# Patient Record
Sex: Female | Born: 1965
Health system: Southern US, Community
[De-identification: ages and names within clinical notes are randomized; demographics above are authoritative.]

---

## 2005-06-19 ENCOUNTER — Ambulatory Visit: Payer: Self-pay | Admitting: Family Medicine

## 2005-09-03 ENCOUNTER — Emergency Department: Payer: Self-pay | Admitting: General Practice

## 2007-10-07 ENCOUNTER — Ambulatory Visit: Payer: Self-pay

## 2007-10-24 ENCOUNTER — Ambulatory Visit: Payer: Self-pay | Admitting: Gastroenterology

## 2008-11-05 ENCOUNTER — Ambulatory Visit: Payer: Self-pay

## 2009-11-15 ENCOUNTER — Ambulatory Visit: Payer: Self-pay

## 2010-12-21 ENCOUNTER — Ambulatory Visit: Payer: Self-pay

## 2011-01-11 ENCOUNTER — Emergency Department: Payer: Self-pay | Admitting: *Deleted

## 2011-01-22 ENCOUNTER — Ambulatory Visit: Payer: Self-pay | Admitting: Surgery

## 2011-01-23 ENCOUNTER — Ambulatory Visit: Payer: Self-pay | Admitting: Surgery

## 2012-01-09 ENCOUNTER — Ambulatory Visit: Payer: Self-pay

## 2012-02-14 ENCOUNTER — Ambulatory Visit: Payer: Self-pay | Admitting: Surgery

## 2012-02-14 LAB — COMPREHENSIVE METABOLIC PANEL
Albumin: 4 g/dL (ref 3.4–5.0)
Anion Gap: 7 (ref 7–16)
Bilirubin,Total: 0.3 mg/dL (ref 0.2–1.0)
Calcium, Total: 8.9 mg/dL (ref 8.5–10.1)
Co2: 24 mmol/L (ref 21–32)
Creatinine: 0.92 mg/dL (ref 0.60–1.30)
EGFR (African American): >60
EGFR (Non-African Amer.): >60
Osmolality: 278 (ref 275–301)
Total Protein: 7.5 g/dL (ref 6.4–8.2)

## 2012-02-14 LAB — CBC WITH DIFFERENTIAL/PLATELET
Basophil #: 0 10*3/uL (ref 0.0–0.1)
Eosinophil #: 0.1 10*3/uL (ref 0.0–0.7)
Eosinophil %: 1.7 %
HCT: 37.9 % (ref 35.0–47.0)
HGB: 12.9 g/dL (ref 12.0–16.0)
Lymphocyte %: 28.9 %
MCHC: 33.9 g/dL (ref 32.0–36.0)
Neutrophil #: 3.8 10*3/uL (ref 1.4–6.5)
Neutrophil %: 61 %
RBC: 4.15 10*6/uL (ref 3.80–5.20)
RDW: 12.4 % (ref 11.5–14.5)
WBC: 6.2 10*3/uL (ref 3.6–11.0)

## 2012-02-14 LAB — LIPASE, BLOOD: Lipase: 237 U/L (ref 73–393)

## 2012-02-18 ENCOUNTER — Ambulatory Visit: Payer: Self-pay | Admitting: Surgery

## 2012-02-20 LAB — PATHOLOGY REPORT

## 2014-02-05 ENCOUNTER — Ambulatory Visit: Payer: Self-pay

## 2014-02-12 ENCOUNTER — Ambulatory Visit: Payer: Self-pay

## 2014-06-22 NOTE — H&P (Signed)
PATIENT NAME:  WILSON, SAMPLE MR#:  [id number] DATE OF BIRTH:  [birth year]  DATE OF ADMISSION:  02/18/2012  CHIEF COMPLAINT: Right upper quadrant pain.   HISTORY OF PRESENT ILLNESS: This is a 49 year old Caucasian female patient with a history of recurrent episodes of right upper quadrant pain associated with fatty food intolerance. She has a remote history over a year ago of similar episodes in which she had elevated liver function tests. Those have resolved and her most recent LFTs are normal. She is here for elective laparoscopic cholecystectomy.   PAST MEDICAL HISTORY: None.   PAST SURGICAL HISTORY: None.   ALLERGIES: SULFA.   MEDICATIONS: None.   FAMILY HISTORY: Noncontributory.   SOCIAL HISTORY: The patient does not smoke or drink.   REVIEW OF SYSTEMS: A 10 system review has been performed and negative with the exception of that mentioned in the HPI.   PHYSICAL EXAMINATION:  GENERAL: Healthy female patient.   HEENT: No scleral icterus.   NECK: No palpable neck nodes.   CHEST: Clear to auscultation.   CARDIAC: Regular rate and rhythm.   ABDOMEN: Soft, nontender.   EXTREMITIES: No edema.   NEUROLOGIC: Grossly intact.   INTEGUMENT: No jaundice.   LABORATORY DATA: Values demonstrate normal liver function tests, at this time.   ASSESSMENT AND PLAN: This is a patient with symptomatic gallstones. She is here for elective laparoscopic cholecystectomy. The rationale for surgery has been discussed. The options of observation have been reviewed and the risks of bleeding, infection, recurrence of symptoms, failure to resolve her symptoms, open procedure, bile duct damage, bile duct leak and retained common bile duct stone any of which could require further surgery and/or ERCP, stent and papillotomy have all been reviewed. She understood and agreed to proceed.  ____________________________ Jerrol Banana Burt Knack, MD rec:sb D: 02/17/2012 19:46:00 ET T: 02/18/2012 07:43:16  ET JOB#: [id number]  cc: Jerrol Banana. Burt Knack, MD, <Dictator> Florene Glen MD ELECTRONICALLY SIGNED 02/18/2012 8:41

## 2014-06-22 NOTE — Op Note (Signed)
PATIENT NAME:  Shawna Morales, Shawna Morales MR#:  [id number] DATE OF BIRTH:  [birth year]  DATE OF PROCEDURE:  02/18/2012  PREOPERATIVE DIAGNOSIS:  Symptomatic cholelithiasis.   POSTOPERATIVE DIAGNOSIS:  Symptomatic cholelithiasis.   PROCEDURE:  Laparoscopic cholecystectomy.   SURGEON:  Phoebe Perch, MD  ANESTHESIA:  General with endotracheal tube.   INDICATIONS: This is a patient with a history of recurrent episodes of right upper quadrant pain associated with fatty food intolerance. In the remote past she had elevated liver function tests, but has not had those elevated on rechecks. She is here for elective laparoscopic cholecystectomy. Preoperatively we discussed the rationale for surgery, the options of observation, risk of bleeding, infection, recurrence of symptoms, failure to resolve her symptoms, conversion to an open procedure, bile duct damage, bile duct leak, retained common bile duct stone, any of which could require further surgery and/or ERCP, stent and papillotomy. This was all reviewed for her in the preop holding area. She understood and agreed to proceed.   FINDINGS:  Multiple gallstones, adhesions, no acute cholecystitis.   DESCRIPTION OF PROCEDURE: The patient was induced under general anesthesia. She was given IV antibiotics and VTE prophylaxis was in place. She was prepped and draped in a sterile fashion. Marcaine was infiltrated in skin and subcutaneous tissues around the periumbilical area.  The incision was made. Veress needle was placed. Pneumoperitoneum was obtained. A 5 mm trocar port was placed. The abdominal cavity was explored and under direct vision a 10 mm epigastric port and two lateral 5 mm ports were placed. The gallbladder was placed on tension. The peritoneum over the infundibulum was incised bluntly and dissected free as were the adhesions. The cystic duct-gallbladder junction was well identified, doubly clipped and divided. The cystic artery was doubly clipped and divided  and the gallbladder was taken from the gallbladder fossa with electrocautery and passed out through the epigastric port site with the aid of an Endo Catch bag. The area was checked for hemostasis and found to be adequate. There was no sign of bleeding, bile leak or bowel injury. The camera was placed in the epigastric site to view back in the periumbilical site. There was no sign of bleeding or bowel injury. Therefore, pneumoperitoneum was released. All ports were removed. Fascial edges at the epigastric site were approximated with figure-of-eight 0 Vicryls. 4-0. Subcuticular Monocryl was used at all skin edges. Steri-Strips, Mastisol and sterile dressings were placed.   The patient tolerated the procedure well. There were no complications. She was taken to the recovery room in stable condition to be discharged in the care of her family. Follow-up in 10 days.    ____________________________ Jerrol Banana Burt Knack, MD rec:ct D: 02/18/2012 09:30:00 ET T: 02/19/2012 10:00:14 ET JOB#: [id number]  cc: Jerrol Banana. Burt Knack, MD, <Dictator> Florene Glen MD ELECTRONICALLY SIGNED 02/19/2012 12:28

## 2015-03-02 ENCOUNTER — Other Ambulatory Visit: Payer: Self-pay | Admitting: Obstetrics and Gynecology

## 2015-03-02 DIAGNOSIS — Z1231 Encounter for screening mammogram for malignant neoplasm of breast: Secondary | ICD-10-CM

## 2015-03-09 ENCOUNTER — Ambulatory Visit
Admission: RE | Admit: 2015-03-09 | Discharge: 2015-03-09 | Disposition: A | Discharge status: Home / Self Care | Payer: 59 | Source: Ambulatory Visit | Attending: Obstetrics and Gynecology | Admitting: Obstetrics and Gynecology

## 2015-03-09 DIAGNOSIS — Z1231 Encounter for screening mammogram for malignant neoplasm of breast: Secondary | ICD-10-CM | POA: Diagnosis present

## 2016-02-29 ENCOUNTER — Other Ambulatory Visit: Payer: Self-pay | Admitting: Obstetrics and Gynecology

## 2016-02-29 DIAGNOSIS — Z1231 Encounter for screening mammogram for malignant neoplasm of breast: Secondary | ICD-10-CM

## 2016-03-28 ENCOUNTER — Ambulatory Visit
Admission: RE | Admit: 2016-03-28 | Discharge: 2016-03-28 | Disposition: A | Discharge status: Home / Self Care | Payer: 59 | Source: Ambulatory Visit | Attending: Obstetrics and Gynecology | Admitting: Obstetrics and Gynecology

## 2016-03-28 DIAGNOSIS — Z1231 Encounter for screening mammogram for malignant neoplasm of breast: Secondary | ICD-10-CM | POA: Insufficient documentation

## 2016-04-19 DIAGNOSIS — D126 Benign neoplasm of colon, unspecified: Secondary | ICD-10-CM | POA: Diagnosis not present

## 2016-04-19 DIAGNOSIS — Z0000 Encounter for general adult medical examination without abnormal findings: Secondary | ICD-10-CM | POA: Diagnosis not present

## 2016-04-19 DIAGNOSIS — Z780 Asymptomatic menopausal state: Secondary | ICD-10-CM | POA: Diagnosis not present

## 2017-03-11 DIAGNOSIS — R05 Cough: Secondary | ICD-10-CM | POA: Diagnosis not present

## 2017-03-11 DIAGNOSIS — J040 Acute laryngitis: Secondary | ICD-10-CM | POA: Diagnosis not present

## 2017-04-23 DIAGNOSIS — Z1231 Encounter for screening mammogram for malignant neoplasm of breast: Secondary | ICD-10-CM | POA: Diagnosis not present

## 2017-04-23 DIAGNOSIS — Z1159 Encounter for screening for other viral diseases: Secondary | ICD-10-CM | POA: Diagnosis not present

## 2017-04-23 DIAGNOSIS — Z124 Encounter for screening for malignant neoplasm of cervix: Secondary | ICD-10-CM | POA: Diagnosis not present

## 2017-04-23 DIAGNOSIS — Z0000 Encounter for general adult medical examination without abnormal findings: Secondary | ICD-10-CM | POA: Diagnosis not present

## 2017-06-06 ENCOUNTER — Other Ambulatory Visit: Payer: Self-pay | Admitting: Obstetrics and Gynecology

## 2017-06-06 ENCOUNTER — Other Ambulatory Visit: Payer: Self-pay | Admitting: Physician Assistant

## 2017-06-06 DIAGNOSIS — Z1231 Encounter for screening mammogram for malignant neoplasm of breast: Secondary | ICD-10-CM

## 2017-06-19 ENCOUNTER — Ambulatory Visit
Admission: RE | Admit: 2017-06-19 | Discharge: 2017-06-19 | Disposition: A | Discharge status: Home / Self Care | Payer: 59 | Source: Ambulatory Visit | Attending: Physician Assistant | Admitting: Physician Assistant

## 2017-06-19 DIAGNOSIS — Z1231 Encounter for screening mammogram for malignant neoplasm of breast: Secondary | ICD-10-CM | POA: Insufficient documentation

## 2018-06-25 IMAGING — MG MM DIGITAL SCREENING BILAT W/ CAD
4 series · 4 of 4 positions shown · non-contrast
Comparison: Previous exam(s).

CLINICAL DATA: Screening.

EXAM:
DIGITAL SCREENING BILATERAL MAMMOGRAM WITH CAD

[L MLO]
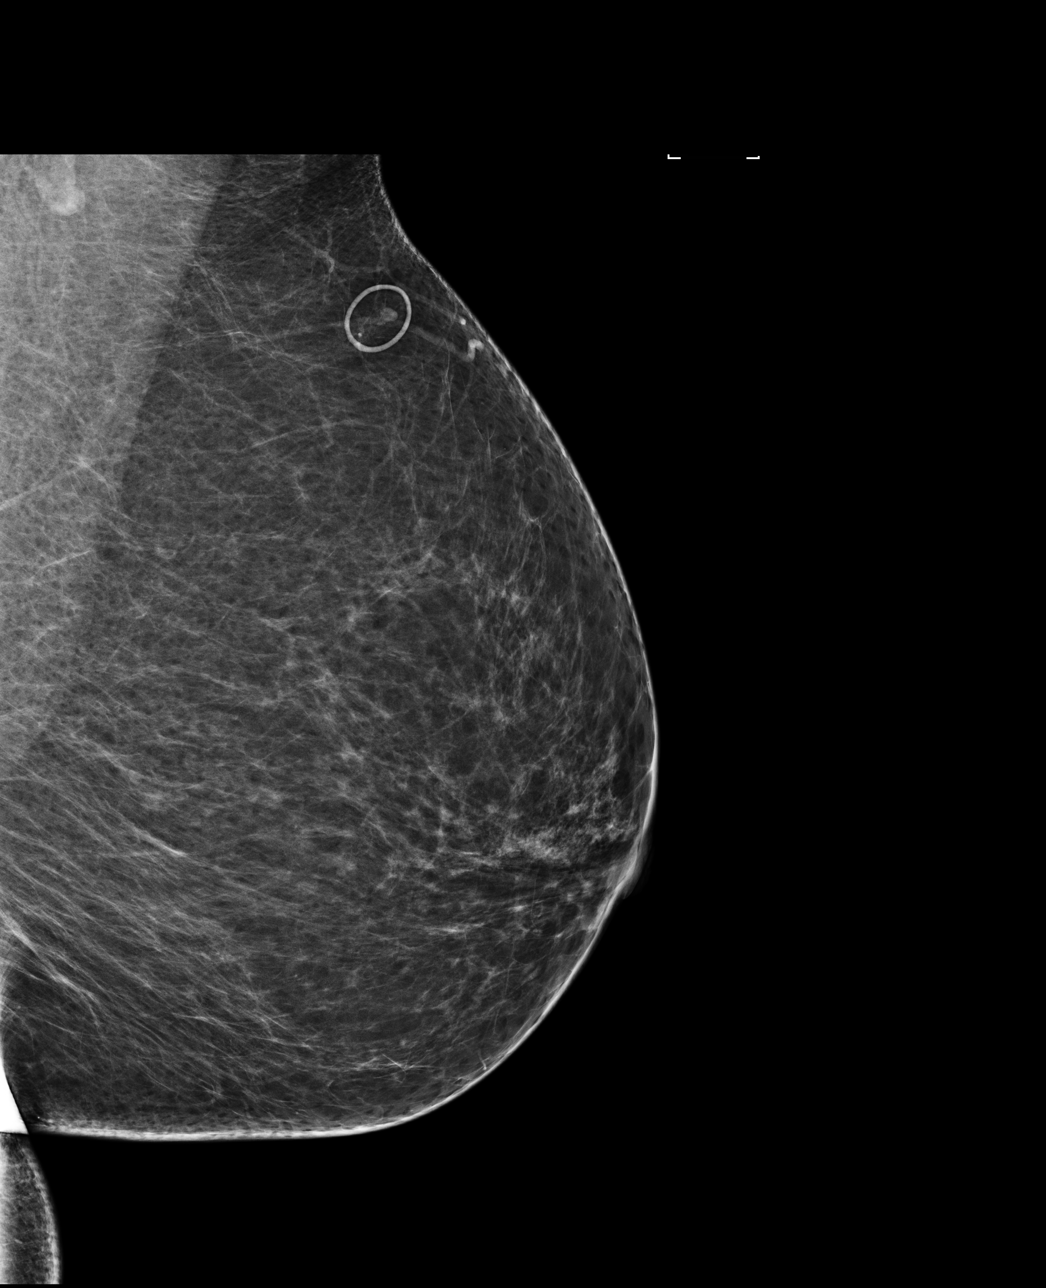

[L CC]
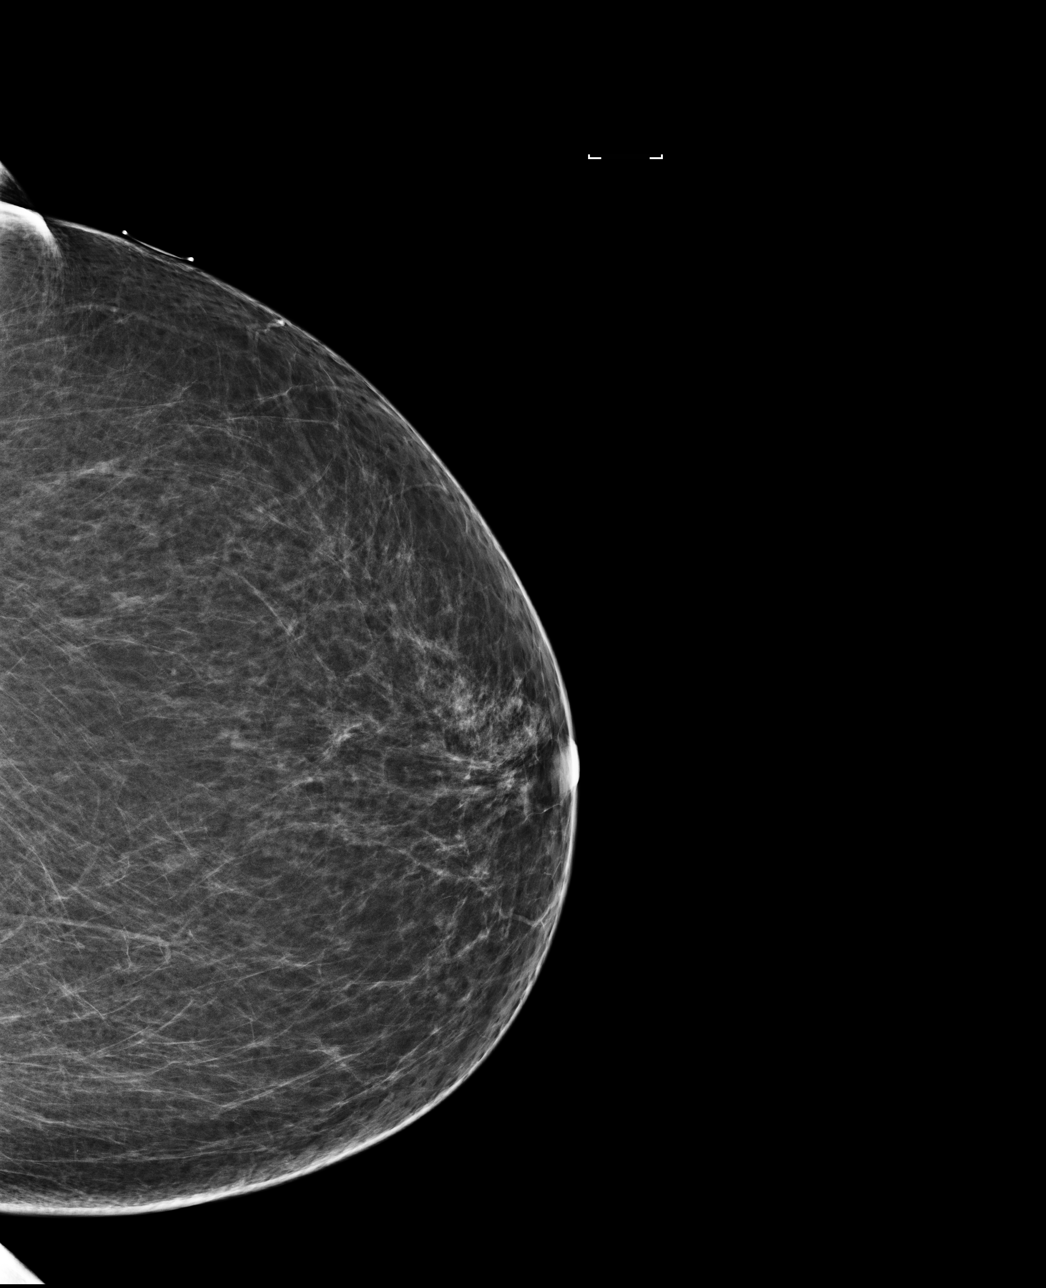

[R CC]
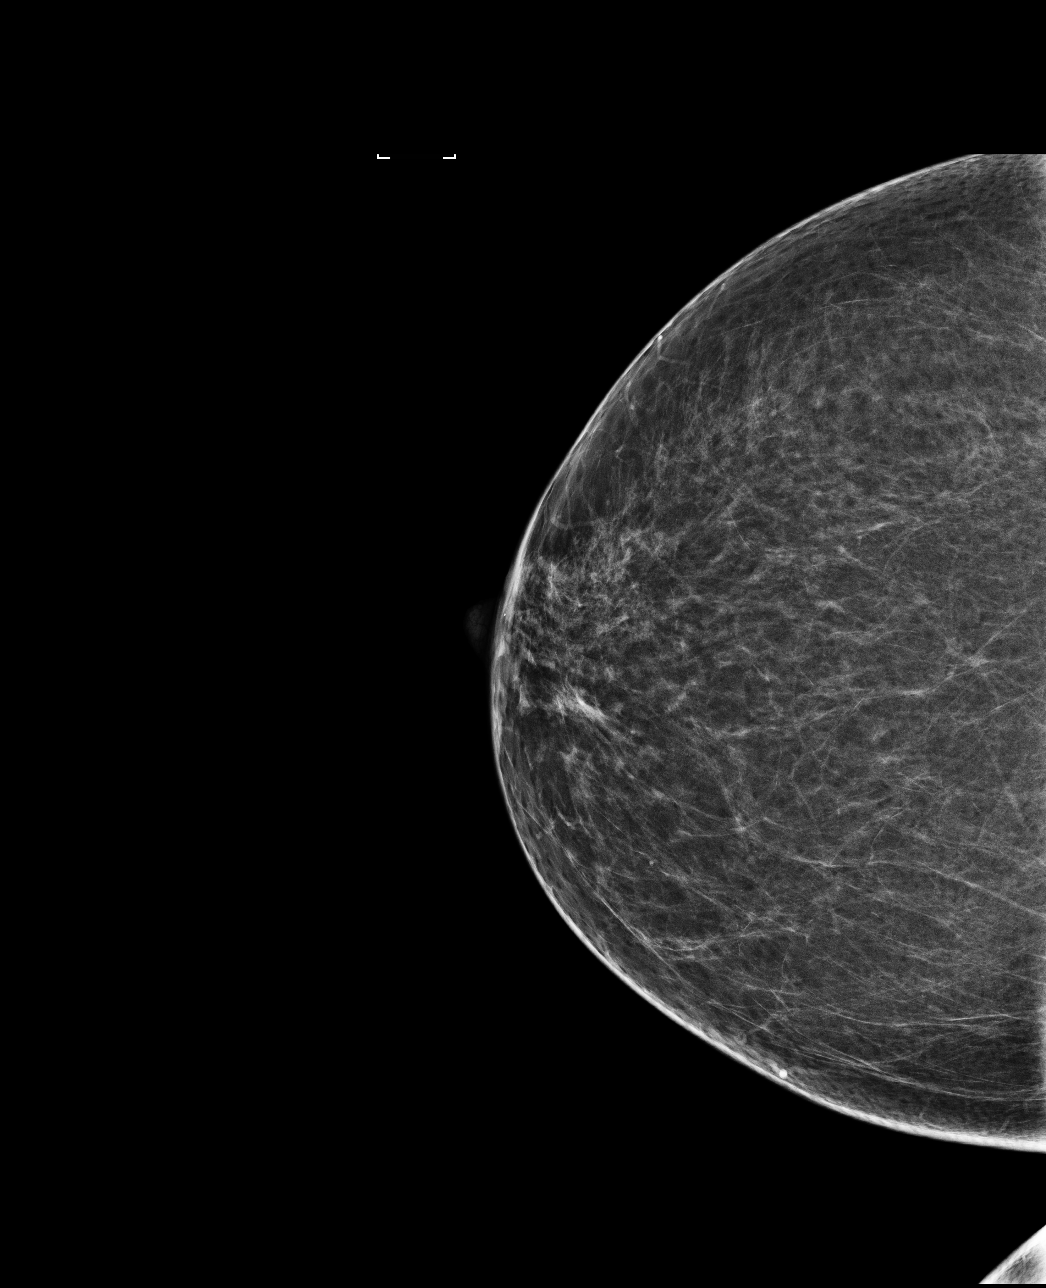

[R MLO]
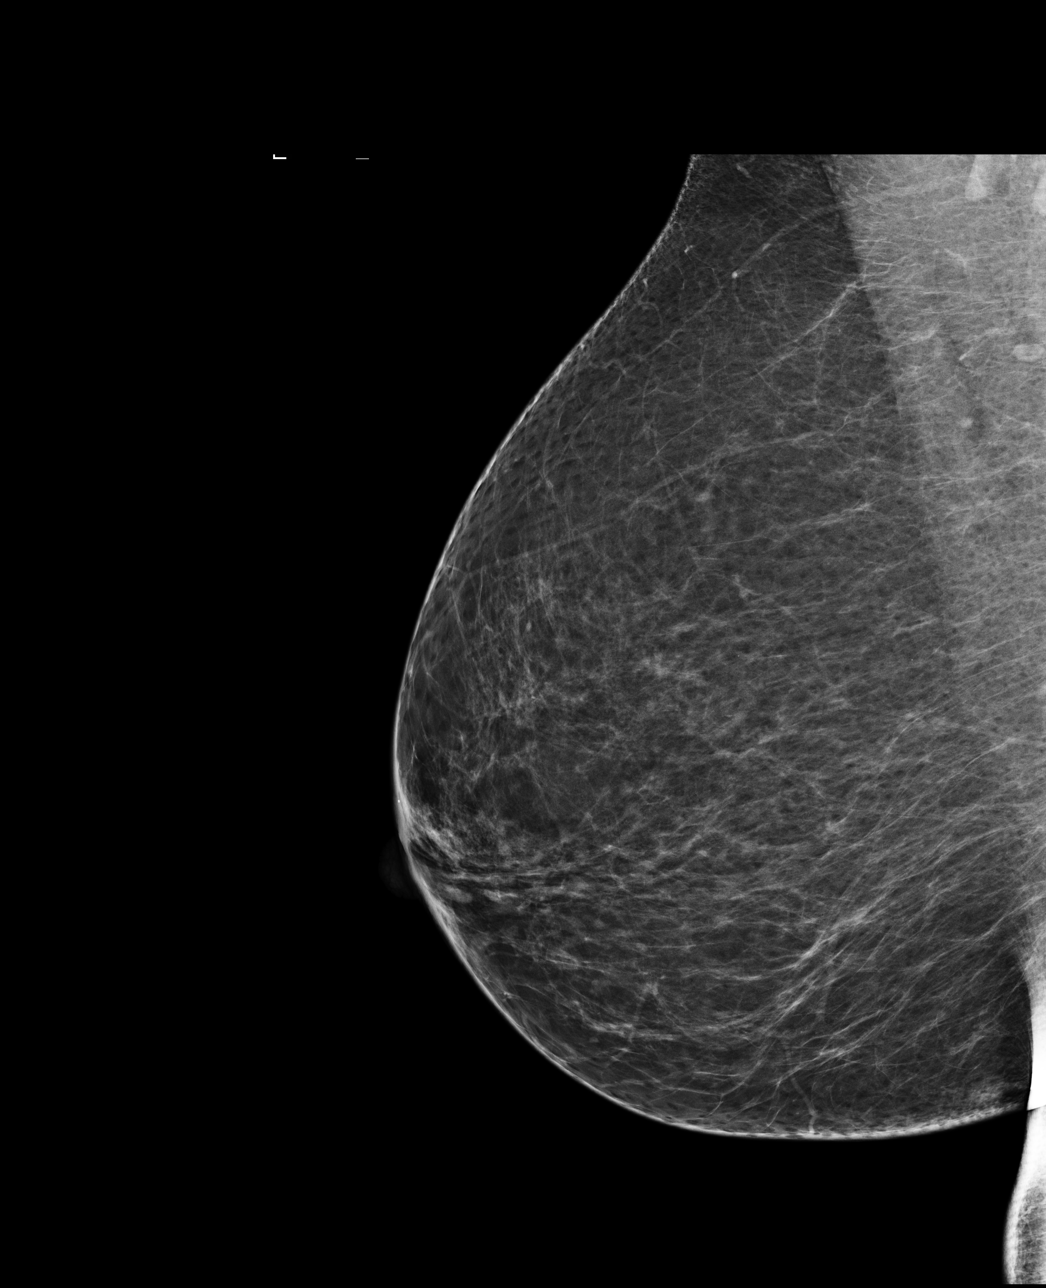

[4 of 4 positions shown; findings below may reference images not displayed]

ACR Breast Density Category b: There are scattered areas of
fibroglandular density.
FINDINGS: There are no findings suspicious for malignancy. Images were
processed with CAD.
IMPRESSION: No mammographic evidence of malignancy. A result letter of this
screening mammogram will be mailed directly to the patient.

RECOMMENDATION:
Screening mammogram in one year. (Code:AS-G-LCT)

BI-RADS CATEGORY  1: Negative.

## 2018-07-09 ENCOUNTER — Other Ambulatory Visit: Payer: Self-pay | Admitting: Physician Assistant

## 2018-07-09 DIAGNOSIS — Z1231 Encounter for screening mammogram for malignant neoplasm of breast: Secondary | ICD-10-CM

## 2018-07-23 ENCOUNTER — Ambulatory Visit
Admission: RE | Admit: 2018-07-23 | Discharge: 2018-07-23 | Disposition: A | Discharge status: Home / Self Care | Payer: Managed Care, Other (non HMO) | Source: Ambulatory Visit | Attending: Physician Assistant | Admitting: Physician Assistant

## 2018-07-23 ENCOUNTER — Other Ambulatory Visit: Payer: Self-pay

## 2018-07-23 DIAGNOSIS — Z1231 Encounter for screening mammogram for malignant neoplasm of breast: Secondary | ICD-10-CM | POA: Insufficient documentation

## 2018-09-29 ENCOUNTER — Other Ambulatory Visit: Payer: Self-pay

## 2018-09-29 DIAGNOSIS — Z20822 Contact with and (suspected) exposure to covid-19: Secondary | ICD-10-CM

## 2018-09-29 NOTE — Progress Notes (Unsigned)
lab

## 2019-10-14 ENCOUNTER — Other Ambulatory Visit: Payer: Self-pay | Admitting: Physician Assistant

## 2019-10-14 DIAGNOSIS — Z1231 Encounter for screening mammogram for malignant neoplasm of breast: Secondary | ICD-10-CM

## 2019-10-19 ENCOUNTER — Ambulatory Visit
Admission: RE | Admit: 2019-10-19 | Discharge: 2019-10-19 | Disposition: A | Discharge status: Home / Self Care | Payer: No Typology Code available for payment source | Source: Ambulatory Visit | Attending: Physician Assistant | Admitting: Physician Assistant

## 2019-10-19 ENCOUNTER — Other Ambulatory Visit: Payer: Self-pay

## 2019-10-19 DIAGNOSIS — Z1231 Encounter for screening mammogram for malignant neoplasm of breast: Secondary | ICD-10-CM | POA: Insufficient documentation

## 2020-10-13 ENCOUNTER — Other Ambulatory Visit: Payer: Self-pay | Admitting: Physician Assistant

## 2020-10-13 DIAGNOSIS — Z1231 Encounter for screening mammogram for malignant neoplasm of breast: Secondary | ICD-10-CM

## 2020-11-03 ENCOUNTER — Other Ambulatory Visit: Payer: Self-pay

## 2020-11-03 ENCOUNTER — Ambulatory Visit
Admission: RE | Admit: 2020-11-03 | Discharge: 2020-11-03 | Disposition: A | Discharge status: Home / Self Care | Payer: No Typology Code available for payment source | Source: Ambulatory Visit | Attending: Physician Assistant | Admitting: Physician Assistant

## 2020-11-03 DIAGNOSIS — Z1231 Encounter for screening mammogram for malignant neoplasm of breast: Secondary | ICD-10-CM | POA: Diagnosis not present

## 2021-10-16 DIAGNOSIS — R7303 Prediabetes: Secondary | ICD-10-CM | POA: Diagnosis not present

## 2021-10-16 DIAGNOSIS — Z1231 Encounter for screening mammogram for malignant neoplasm of breast: Secondary | ICD-10-CM | POA: Diagnosis not present

## 2021-10-16 DIAGNOSIS — Z780 Asymptomatic menopausal state: Secondary | ICD-10-CM | POA: Diagnosis not present

## 2021-10-16 DIAGNOSIS — Z0000 Encounter for general adult medical examination without abnormal findings: Secondary | ICD-10-CM | POA: Diagnosis not present

## 2021-10-17 ENCOUNTER — Other Ambulatory Visit: Payer: Self-pay | Admitting: Physician Assistant

## 2021-10-17 DIAGNOSIS — Z1231 Encounter for screening mammogram for malignant neoplasm of breast: Secondary | ICD-10-CM

## 2021-11-08 ENCOUNTER — Ambulatory Visit
Admission: RE | Admit: 2021-11-08 | Discharge: 2021-11-08 | Disposition: A | Discharge status: Home / Self Care | Payer: 59 | Source: Ambulatory Visit | Attending: Physician Assistant | Admitting: Physician Assistant

## 2021-11-08 DIAGNOSIS — Z1231 Encounter for screening mammogram for malignant neoplasm of breast: Secondary | ICD-10-CM | POA: Insufficient documentation

## 2022-12-12 ENCOUNTER — Other Ambulatory Visit: Payer: Self-pay | Admitting: Physician Assistant

## 2022-12-12 DIAGNOSIS — Z1231 Encounter for screening mammogram for malignant neoplasm of breast: Secondary | ICD-10-CM

## 2022-12-20 ENCOUNTER — Ambulatory Visit
Admission: RE | Admit: 2022-12-20 | Discharge: 2022-12-20 | Disposition: A | Discharge status: Home / Self Care | Payer: 59 | Source: Ambulatory Visit | Attending: Physician Assistant | Admitting: Physician Assistant

## 2022-12-20 DIAGNOSIS — Z1231 Encounter for screening mammogram for malignant neoplasm of breast: Secondary | ICD-10-CM | POA: Insufficient documentation
